# Patient Record
Sex: Male | Born: 1980 | Hispanic: Yes | Marital: Married | Smoking: Never smoker
Health system: Southern US, Community
[De-identification: ages and names within clinical notes are randomized; demographics above are authoritative.]

---

## 2013-06-11 ENCOUNTER — Emergency Department (HOSPITAL_COMMUNITY): Payer: Worker's Compensation

## 2013-06-11 ENCOUNTER — Emergency Department (HOSPITAL_COMMUNITY)
Admission: EM | Admit: 2013-06-11 | Discharge: 2013-06-11 | Disposition: A | Discharge status: Home / Self Care | Payer: Worker's Compensation | Attending: Emergency Medicine | Admitting: Emergency Medicine

## 2013-06-11 ENCOUNTER — Encounter (HOSPITAL_COMMUNITY): Payer: Self-pay | Admitting: Emergency Medicine

## 2013-06-11 DIAGNOSIS — Y9389 Activity, other specified: Secondary | ICD-10-CM | POA: Insufficient documentation

## 2013-06-11 DIAGNOSIS — S61209A Unspecified open wound of unspecified finger without damage to nail, initial encounter: Secondary | ICD-10-CM | POA: Insufficient documentation

## 2013-06-11 DIAGNOSIS — S61208A Unspecified open wound of other finger without damage to nail, initial encounter: Secondary | ICD-10-CM

## 2013-06-11 DIAGNOSIS — Y990 Civilian activity done for income or pay: Secondary | ICD-10-CM | POA: Insufficient documentation

## 2013-06-11 DIAGNOSIS — Y9289 Other specified places as the place of occurrence of the external cause: Secondary | ICD-10-CM | POA: Insufficient documentation

## 2013-06-11 DIAGNOSIS — W230XXA Caught, crushed, jammed, or pinched between moving objects, initial encounter: Secondary | ICD-10-CM | POA: Insufficient documentation

## 2013-06-11 DIAGNOSIS — Z23 Encounter for immunization: Secondary | ICD-10-CM | POA: Insufficient documentation

## 2013-06-11 MED ORDER — TETANUS-DIPHTH-ACELL PERTUSSIS 5-2.5-18.5 LF-MCG/0.5 IM SUSP
0.5000 mL | Freq: Once | INTRAMUSCULAR | Status: AC
  Administered 2013-06-11: 0.5 mL via INTRAMUSCULAR
  Filled 2013-06-11: qty 0.5

## 2013-06-11 MED ORDER — TRAMADOL HCL 50 MG PO TABS: 50.0000 mg | ORAL_TABLET | Freq: Four times a day (QID) | ORAL | Status: DC | PRN

## 2013-06-11 MED ORDER — TRAMADOL HCL 50 MG PO TABS: 50.0000 mg | ORAL_TABLET | Freq: Four times a day (QID) | ORAL | Status: AC | PRN

## 2013-06-11 MED ORDER — IBUPROFEN 600 MG PO TABS: 600.0000 mg | ORAL_TABLET | Freq: Four times a day (QID) | ORAL | Status: DC | PRN

## 2013-06-11 MED ORDER — CEPHALEXIN 500 MG PO CAPS: 500.0000 mg | ORAL_CAPSULE | Freq: Four times a day (QID) | ORAL | Status: DC

## 2013-06-11 MED ORDER — IBUPROFEN 800 MG PO TABS
800.0000 mg | ORAL_TABLET | Freq: Once | ORAL | Status: AC
  Administered 2013-06-11: 800 mg via ORAL
  Filled 2013-06-11: qty 1

## 2013-06-11 MED ORDER — IBUPROFEN 600 MG PO TABS: 600.0000 mg | ORAL_TABLET | Freq: Four times a day (QID) | ORAL | Status: AC | PRN

## 2013-06-11 MED ORDER — HYDROCODONE-ACETAMINOPHEN 5-325 MG PO TABS
2.0000 | ORAL_TABLET | Freq: Once | ORAL | Status: AC
  Administered 2013-06-11: 2 via ORAL
  Filled 2013-06-11: qty 2

## 2013-06-11 MED ORDER — CEPHALEXIN 500 MG PO CAPS: 500.0000 mg | ORAL_CAPSULE | Freq: Four times a day (QID) | ORAL | Status: AC
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
## 2013-06-11 NOTE — ED Notes (Signed)
Pt working putting big piece of metal together when his left middle finger got smashed between the metal. Pt has laceration to tip of finger.

## 2013-06-11 NOTE — Discharge Instructions (Signed)
Avulsión del dedo  (Finger Avulsion)  Cuando se pierde la punta del dedo, puede crecer una nueva uña si parte de la misma se ha conservado. Una nueva uña le podrá crecer nuevamente si queda parte de ella. La nueva uña podrá crecer deformada. Si ha perdido sólo la punta del dedo, no es necesario practicar ningún tipo de reparación, a menos que se observe el hueso. Si el hueso puede verse, el profesional necesitará retirar el hueso que protruye y le colocará un vendaje. El profesional hará lo que considere mejor para usted. Generalmente cuando se pierde la punta del dedo, el extremo crecerá nuevamente en forma gradual y tendrá un aspecto bastante normal, pero puede quedar sensible a la presión y a temperaturas extremas durante un largo tiempo.  INSTRUCCIONES PARA EL CUIDADO DOMICILIARIO  · Mantenga la mano elevada por encima del corazón para aliviar el dolor y la hinchazón.  · Mantenga el vendaje seco y limpio.  · Cambie el vendaje cada 24 horas o según le hayan indicado.  · Utilice los medicamentos de venta libre o de prescripción para el dolor, el malestar o la fiebre, según se lo indique el profesional que lo asiste.  · Consulte al profesional que lo asiste si ocurre algún problema.  SOLICITE ATENCIÓN MÉDICA SI:  · El dolor, la hinchazón, la hemorragia o la secreción aumentan.  · Tiene fiebre.  · Presenta hinchazón que comienza en el dedo y luego se extiende hacia la mano.  Asegúrese de verificar si necesita colocarse la vacuna contra el tétanos.  Document Released: 12/16/2004 Document Revised: 09/07/2011  [employer]® Patient Information ©2014 [employer].

## 2013-06-16 NOTE — ED Provider Notes (Signed)
Medical screening examination/treatment/procedure(s) were performed by non-physician practitioner and as supervising physician I was immediately available for consultation/collaboration.   EKG Interpretation None        Ilka Lovick, MD 06/16/13 1049 

## 2015-10-05 IMAGING — CR DG FINGER MIDDLE 2+V*L*
3 series · 3 of 3 positions shown · non-contrast
Comparison: None.

CLINICAL DATA: Injury.

EXAM:
LEFT MIDDLE FINGER 2+V

[x finger pa left]
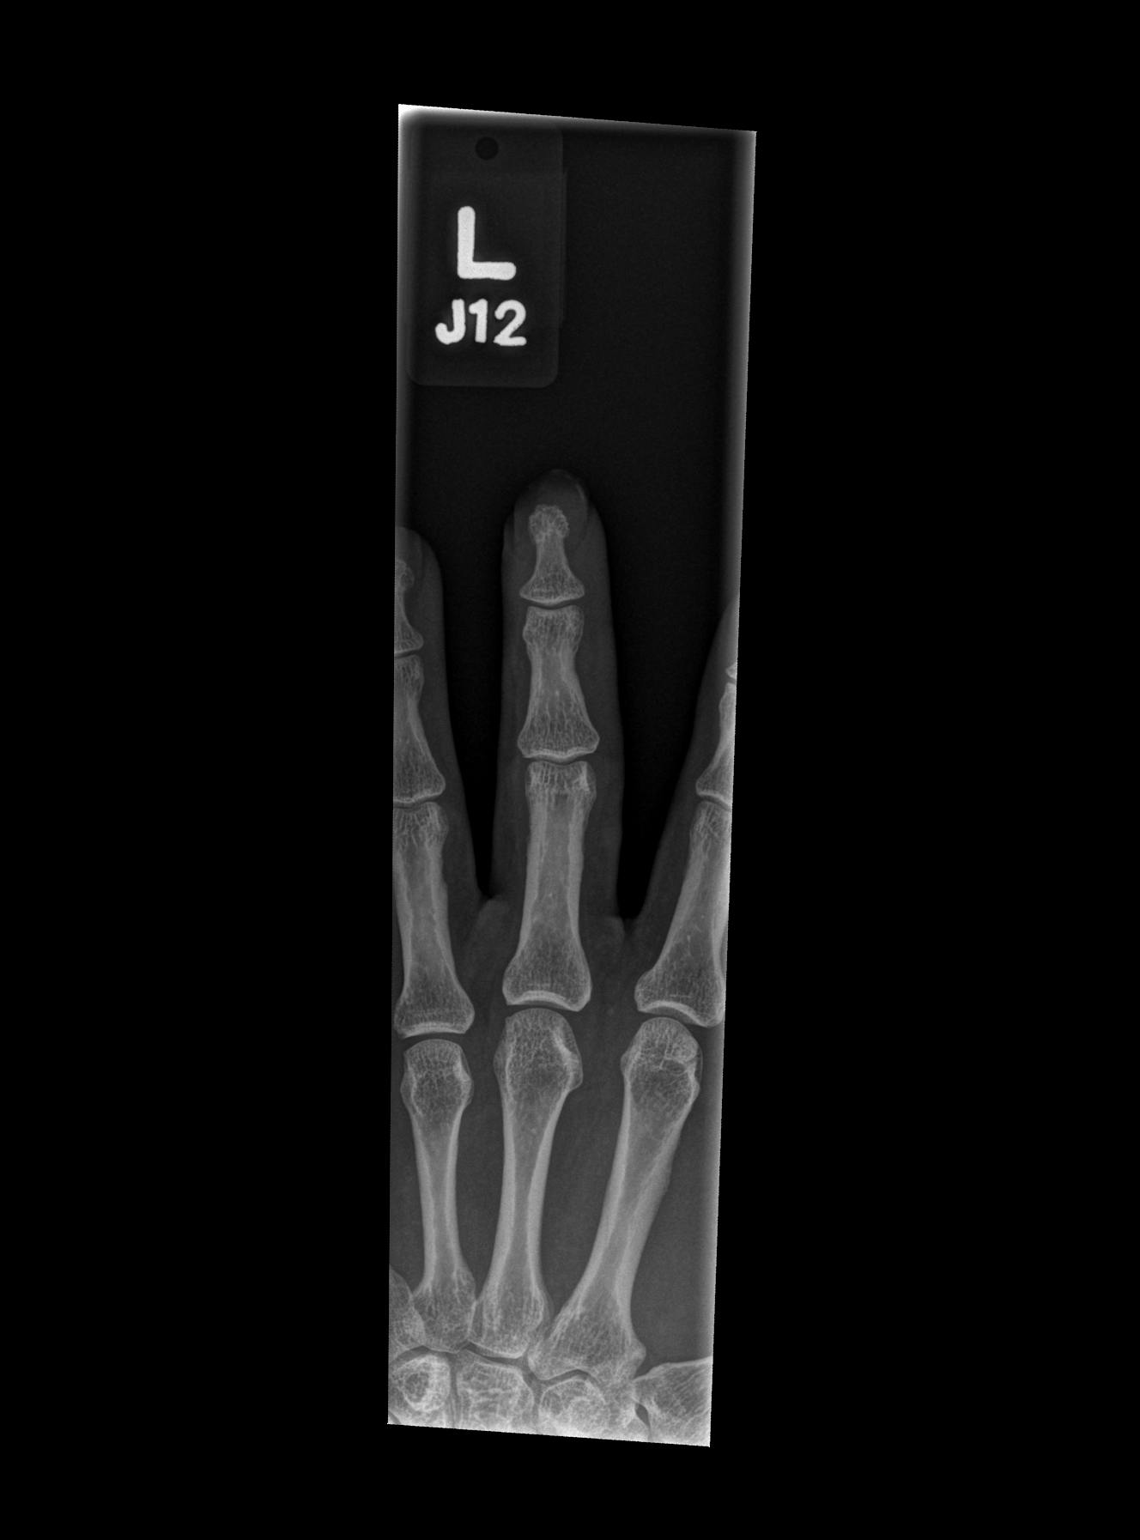

[x finger obl left]
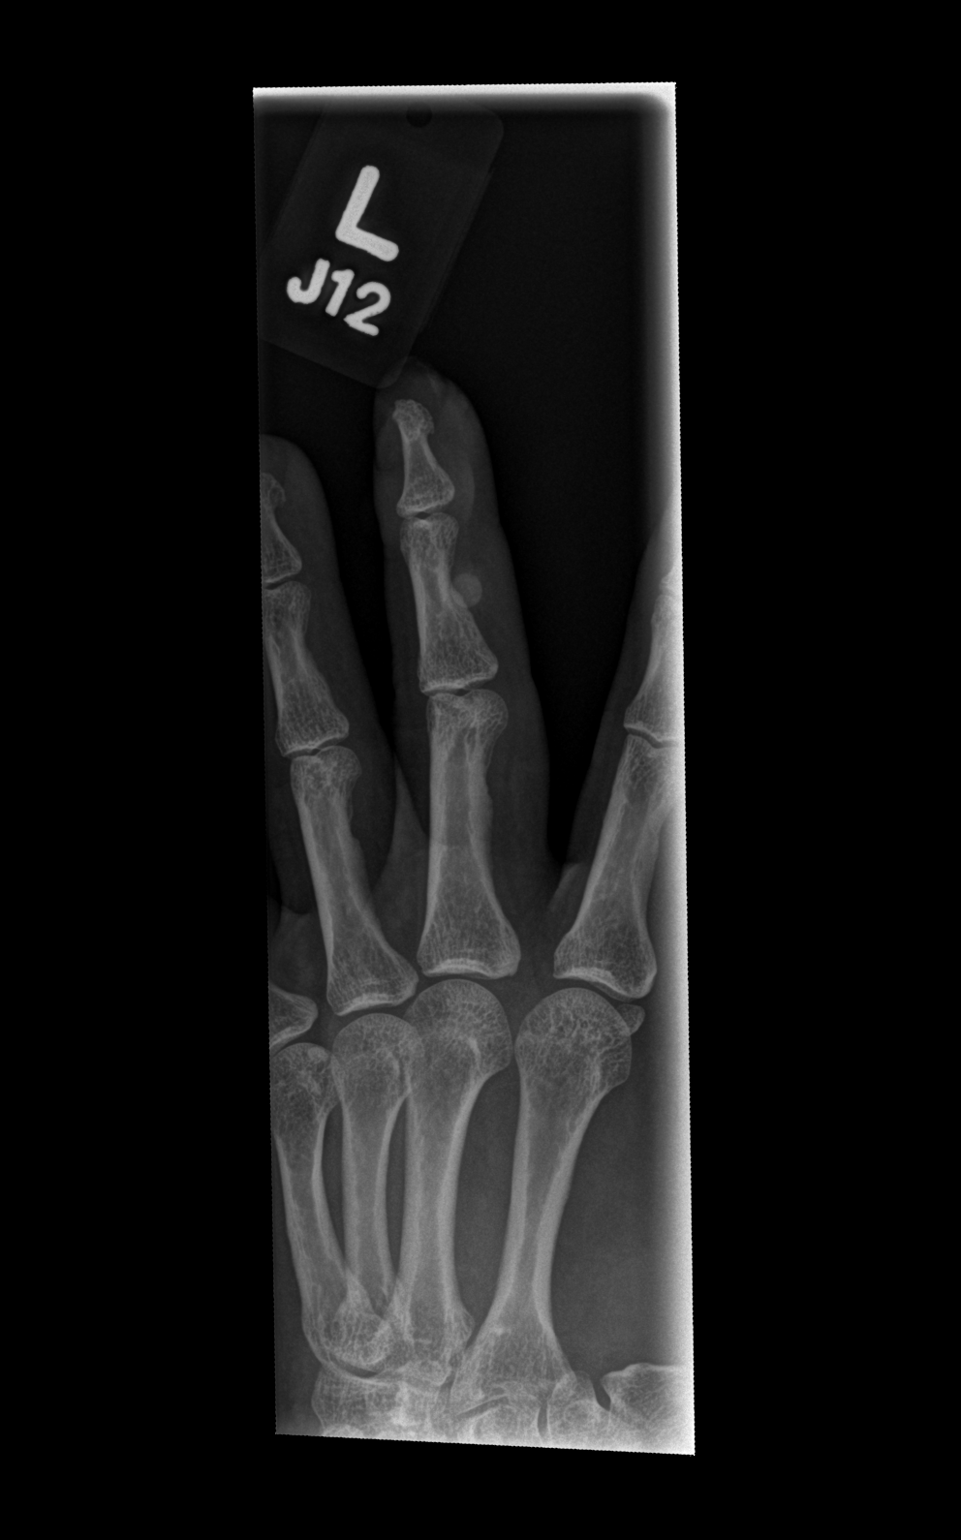

[x finger lat left]
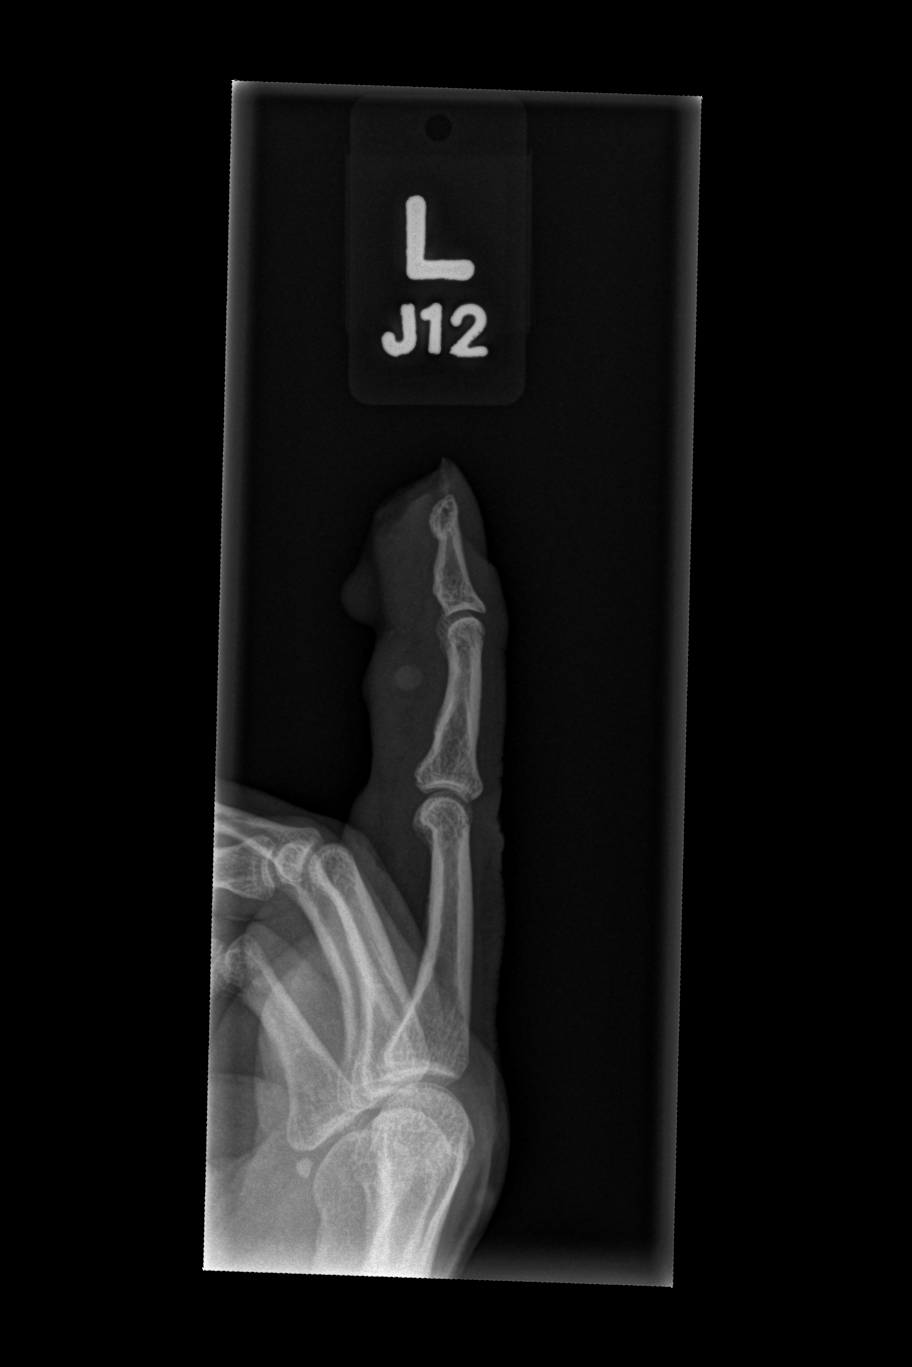

[3 of 3 positions shown; findings below may reference images not displayed]

FINDINGS: Soft tissue injury tuft of the left middle finger extending to the
nailbed. No underlying fracture or dislocation.

Nonspecific 5 x 4 x 4 mm rounded radiopaque structure within the
soft tissue palmar aspect of the left middle finger middle phalanx
of indeterminate etiology.
IMPRESSION: Soft tissue injury tuft of the left middle finger extending to the
nailbed. No underlying fracture or dislocation.

Nonspecific 5 x 4 x 4 mm rounded radiopaque structure within the
soft tissue palmar aspect of the left middle finger middle phalanx
of indeterminate etiology.
